# Patient Record
Sex: Female | Born: 1995 | Hispanic: No | Marital: Single | Smoking: Current every day smoker
Health system: Southern US, Community
[De-identification: ages and names within clinical notes are randomized; demographics above are authoritative.]

## PROBLEM LIST (undated history)

## (undated) HISTORY — PX: TONSILLECTOMY: SUR1361

## (undated) HISTORY — PX: WISDOM TOOTH EXTRACTION: SHX21

## (undated) HISTORY — PX: ADENOIDECTOMY: SUR15

---
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
## 2015-03-26 ENCOUNTER — Encounter: Payer: Self-pay | Admitting: *Deleted

## 2015-03-26 ENCOUNTER — Emergency Department
Admission: EM | Admit: 2015-03-26 | Discharge: 2015-03-26 | Disposition: A | Discharge status: Home / Self Care | Payer: Medicaid Other | Source: Home / Self Care | Attending: Family Medicine | Admitting: Family Medicine

## 2015-03-26 DIAGNOSIS — J029 Acute pharyngitis, unspecified: Secondary | ICD-10-CM | POA: Diagnosis not present

## 2015-03-26 LAB — POCT RAPID STREP A (OFFICE): Rapid Strep A Screen: NEGATIVE

## 2015-03-26 NOTE — Discharge Instructions (Signed)

## 2015-03-26 NOTE — ED Provider Notes (Signed)
CSN: [id number]     Arrival date & time 03/26/15  1114 History   First MD Initiated Contact with Patient 03/26/15 1140     Chief Complaint  Patient presents with  . Sore Throat   (Consider location/radiation/quality/duration/timing/severity/associated sxs/prior Treatment) HPI Pt is a 19yo female presenting to [facility] with c/o body aches with associated sore throat, headache and fever, Tmax 100.5.  Denies sick contacts or recent travel.  She did take ibuprofen last night that provided moderate relief but no medication today for symptoms. Denies CP or SOB. Denies cough, congestion, n/v/d.  History reviewed. No pertinent past medical history. Past Surgical History  Procedure Laterality Date  . Tonsillectomy    . Adenoidectomy    . Wisdom tooth extraction     History reviewed. No pertinent family history. Social History  Substance Use Topics  . Smoking status: Current Every Day Smoker -- 0.50 packs/day    Types: Cigarettes  . Smokeless tobacco: Current User     Comment: e-cig  . Alcohol Use: No   OB History    Gravida Para Term Preterm AB TAB SAB Ectopic Multiple Living   1              Review of Systems  Constitutional: Positive for fever ( 100.5), chills and fatigue.  HENT: Positive for sore throat. Negative for congestion, ear pain, trouble swallowing and voice change.   Respiratory: Negative for cough and shortness of breath.   Cardiovascular: Negative for chest pain and palpitations.  Gastrointestinal: Negative for nausea, vomiting, abdominal pain and diarrhea.  Musculoskeletal: Positive for myalgias and arthralgias. Negative for back pain, neck pain and neck stiffness.  Skin: Negative for rash.  Neurological: Positive for dizziness and headaches. Negative for syncope, weakness and light-headedness.  All other systems reviewed and are negative.   Allergies  Sulfa antibiotics  Home Medications   Prior to Admission medications   Not on File   Meds Ordered and  Administered this Visit  Medications - No data to display  BP 95/61 mmHg  Pulse 69  Temp(Src) 98.9 F (37.2 C) (Oral)  Resp 16  Ht  (1.549 m)  Wt 99 lb (44.906 kg)  BMI 18.72 kg/m2  SpO2 100%  Breastfeeding? Unknown No data found.   Physical Exam  Constitutional: She appears well-developed and well-nourished. No distress.  HENT:  Head: Normocephalic and atraumatic.  Right Ear: Hearing, tympanic membrane, external ear and ear canal normal.  Left Ear: Hearing, tympanic membrane, external ear and ear canal normal.  Nose: Nose normal.  Mouth/Throat: Uvula is midline and mucous membranes are normal. Oral lesions (sores in posterior pharynx) present. Posterior oropharyngeal erythema present. No oropharyngeal exudate, posterior oropharyngeal edema or tonsillar abscesses.  Eyes: Conjunctivae are normal. No scleral icterus.  Neck: Normal range of motion. Neck supple.  Cardiovascular: Normal rate, regular rhythm and normal heart sounds.   Pulmonary/Chest: Effort normal and breath sounds normal. No respiratory distress. She has no wheezes. She has no rales. She exhibits no tenderness.  Abdominal: Soft. Bowel sounds are normal. She exhibits no distension and no mass. There is no tenderness. There is no rebound and no guarding.  Musculoskeletal: Normal range of motion.  Lymphadenopathy:    She has no cervical adenopathy.  Neurological: She is alert.  Skin: Skin is warm and dry. She is not diaphoretic.  Nursing note and vitals reviewed.   ED Course  Procedures (including critical care time)  Labs Review Labs Reviewed  STREP A DNA PROBE  POCT RAPID STREP A (OFFICE)    Imaging Review No results found.    MDM   1. Acute pharyngitis, unspecified pharyngitis type    Pt c/o sore throat and body aches. Temp of 100.5 at home. 98.9 in UC w/o OTC medications today. Rapid strep: negative. Will send culture. Reassured pt symptoms are likely viral in nature. Pt has hx of mono but  states those symptoms were much worse. Conservative treatment. Advised pt to use acetaminophen and ibuprofen as needed for fever and pain. Encouraged rest and fluids. F/u in 1 week with PCP if symptoms not improving, sooner if worsening. Pt verbalized understanding and agreement with tx plan.     Junius Finner, PA-C 03/26/15 1330

## 2015-03-26 NOTE — ED Notes (Signed)
Pt c/o sore throat, temp 100.5 and blister on throat x 1 day. No  OTC meds today.

## 2015-03-27 ENCOUNTER — Telehealth: Payer: Self-pay | Admitting: *Deleted

## 2015-03-27 LAB — STREP A DNA PROBE: GASP: NEGATIVE

## 2018-01-27 ENCOUNTER — Other Ambulatory Visit: Payer: Self-pay

## 2018-01-27 ENCOUNTER — Emergency Department (INDEPENDENT_AMBULATORY_CARE_PROVIDER_SITE_OTHER): Payer: Medicaid Other

## 2018-01-27 ENCOUNTER — Emergency Department
Admission: EM | Admit: 2018-01-27 | Discharge: 2018-01-27 | Disposition: A | Discharge status: Home / Self Care | Payer: Medicaid Other | Source: Home / Self Care | Attending: Family Medicine | Admitting: Family Medicine

## 2018-01-27 DIAGNOSIS — M5416 Radiculopathy, lumbar region: Secondary | ICD-10-CM | POA: Diagnosis not present

## 2018-01-27 DIAGNOSIS — M5442 Lumbago with sciatica, left side: Secondary | ICD-10-CM

## 2018-01-27 MED ORDER — PREDNISONE 20 MG PO TABS: ORAL_TABLET | ORAL | 0 refills | Status: DC

## 2018-01-27 MED ORDER — HYDROCODONE-ACETAMINOPHEN 5-325 MG PO TABS: ORAL_TABLET | ORAL | 0 refills | Status: DC

## 2018-01-27 NOTE — ED Provider Notes (Signed)
[facility]    CSN: [id number] Arrival date & time: 01/27/18  1753     History   Chief Complaint Chief Complaint  Patient presents with  . Back Pain    HPI Natalie Levy is a 22 y.o. female.   One week ago patient began developing sharp pain in her left lower back that immediately began to radiate down her left posterior thigh to her knee.  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  She now has pain when lifting trays.   She denies bowel or bladder dysfunction, and no saddle numbness.  She feels well otherwise.  The history is provided by the patient.  Back Pain  Location:  Gluteal region and sacro-iliac joint Quality:  Stabbing and shooting Radiates to:  L posterior upper leg and L knee Pain severity:  Moderate Pain is:  Same all the time Onset quality:  Sudden Duration:  1 week Timing:  Constant Progression:  Unchanged Chronicity:  New Context: lifting heavy objects   Context: not recent injury   Relieved by:  Lying down Worsened by:  Bending, ambulation and standing Ineffective treatments:  OTC medications Associated symptoms: no abdominal pain, no abdominal swelling, no bladder incontinence, no bowel incontinence, no chest pain, no dysuria, no fever, no headaches, no leg pain, no numbness, no paresthesias, no pelvic pain, no perianal numbness, no tingling, no weakness and no weight loss     History reviewed. No pertinent past medical history.  There are no active problems to display for this patient.   Past Surgical History:  Procedure Laterality Date  . ADENOIDECTOMY    . TONSILLECTOMY    . WISDOM TOOTH EXTRACTION      OB History    Gravida  1   Para      Term      Preterm      AB      Living        SAB      TAB      Ectopic      Multiple      Live Births               Home Medications    Prior to Admission medications   Medication Sig Start Date End Date Taking? Authorizing Provider    HYDROcodone-acetaminophen (NORCO/VICODIN) 5-325 MG tablet Take one by mouth at bedtime as needed for pain 01/27/18   Lattie Haw, MD  predniSONE (DELTASONE) 20 MG tablet Take one tab by mouth twice daily for 4 days, then one daily. Take with food. 01/27/18   Lattie Haw, MD    Family History History reviewed. No pertinent family history.  Social History Social History   Tobacco Use  . Smoking status: Current Every Day Smoker    Packs/day: 0.50    Types: Cigarettes, E-cigarettes  . Smokeless tobacco: Current User  . Tobacco comment: e-cig  Substance Use Topics  . Alcohol use: Yes  . Drug use: No     Allergies   Sulfa antibiotics   Review of Systems Review of Systems  Constitutional: Negative for fever and weight loss.  Cardiovascular: Negative for chest pain.  Gastrointestinal: Negative for abdominal pain and bowel incontinence.  Genitourinary: Negative for bladder incontinence, dysuria and pelvic pain.  Musculoskeletal: Positive for back pain.  Neurological: Negative for tingling, weakness, numbness, headaches and paresthesias.  All other systems reviewed and are negative.    Physical Exam Triage Vital Signs ED Triage Vitals  Enc Vitals Group     BP 01/27/18 1813 121/81     Pulse Rate 01/27/18 1813 74     Resp --      Temp 01/27/18 1813 98.3 F (36.8 C)     Temp Source 01/27/18 1813 Oral     SpO2 01/27/18 1813 99 %     Weight 01/27/18 1814 103 lb (46.7 kg)     Height 01/27/18 1814 5\' 1"  (1.549 m)     Head Circumference --      Peak Flow --      Pain Score 01/27/18 1813 5     Pain Loc --      Pain Edu? --      Excl. in GC? --    No data found.  Updated Vital Signs BP 121/81 (BP Location: Right Arm)   Pulse 74   Temp 98.3 F (36.8 C) (Oral)   Ht 5\' 1"  (1.549 m)   Wt 103 lb (46.7 kg)   SpO2 99%   BMI 19.46 kg/m   Visual Acuity Right Eye Distance:   Left Eye Distance:   Bilateral Distance:    Right Eye Near:   Left Eye Near:     Bilateral Near:     Physical Exam  Constitutional: She appears well-developed and well-nourished. No distress.  HENT:  Head: Normocephalic.  Right Ear: External ear normal.  Left Ear: External ear normal.  Nose: Nose normal.  Mouth/Throat: Oropharynx is clear and moist.  Eyes: Pupils are equal, round, and reactive to light. Conjunctivae are normal.  Neck: Normal range of motion.  Cardiovascular: Normal heart sounds.  Pulmonary/Chest: Breath sounds normal.  Abdominal: Soft. There is no tenderness.  Musculoskeletal: She exhibits no edema.       Lumbar back: She exhibits tenderness and bony tenderness. She exhibits normal range of motion and no swelling.       Back:  Back:  Range of motion relatively well preserved.  Can heel/toe walk and squat without difficulty.    Tenderness in the left paraspinous muscles from L4 to Sacral area.  Straight leg raising test is negative.  Sitting knee extension test is positive at 10 degrees from full extension.   Strength and sensation in the lower extremities is normal.  Patellar and achilles reflexes are normal   Neurological: She is alert.  Skin: Skin is warm and dry. No rash noted.  Nursing note and vitals reviewed.    UC Treatments / Results  Labs (all labs ordered are listed, but only abnormal results are displayed) Labs Reviewed - No data to display  EKG None  Radiology Dg Lumbar Spine Complete  Result Date: 01/27/2018 CLINICAL DATA:  Low back pain radiates to left lateral thigh and knee. EXAM: LUMBAR SPINE - COMPLETE 4+ VIEW COMPARISON:  None. FINDINGS: There is no evidence of lumbar spine fracture. Alignment is normal. Intervertebral disc spaces are maintained. IMPRESSION: Negative. Electronically Signed   By: Kennith CenterEric  Mansell M.D.   On: 01/27/2018 19:00    Procedures Procedures (including critical care time)  Medications Ordered in UC Medications - No data to display  Initial Impression / Assessment and Plan / UC Course  I have  reviewed the triage vital signs and the nursing notes.  Pertinent labs & imaging results that were available during my care of the patient were reviewed by me and considered in my medical decision making (see chart for details).    Negative LS spine X=ray reassuring. Begin prednisone burst/taper. Rx for [employer] PowerLortab  at bedtime. Controlled Substance Prescriptions I have consulted the [place] Controlled Substances Registry for this patient, and feel the risk/benefit ratio today is favorable for proceeding with this prescription for a controlled substance.   Followup with Dr. Rodney Langton or Dr. Clementeen Graham ([facility]) if not improving about two weeks.    Final Clinical Impressions(s) / UC Diagnoses   Final diagnoses:  Acute left-sided low back pain with left-sided sciatica     Discharge Instructions     Apply ice pack for 20 to 30 minutes, 3 to 4 times daily  Continue until pain and swelling decrease.  Begin range of motion and stretching exercises as tolerated    ED Prescriptions    Medication Sig Dispense Auth. Provider   predniSONE (DELTASONE) 20 MG tablet Take one tab by mouth twice daily for 4 days, then one daily. Take with food. 12 tablet Lattie Haw, MD   HYDROcodone-acetaminophen (NORCO/VICODIN) 5-325 MG tablet Take one by mouth at bedtime as needed for pain 5 tablet Lattie Haw, MD        Lattie Haw, MD 02/03/18 [id number]

## 2018-01-27 NOTE — Discharge Instructions (Signed)
Apply ice pack for 20 to 30 minutes, 3 to 4 times daily  Continue until pain and swelling decrease.  Begin range of motion and stretching exercises as tolerated. 

## 2018-01-27 NOTE — ED Triage Notes (Signed)
Pt has had lower back pain from left hip shooting down to knee for about 1 week.

## 2019-02-27 ENCOUNTER — Other Ambulatory Visit: Payer: Self-pay

## 2019-02-27 ENCOUNTER — Emergency Department (INDEPENDENT_AMBULATORY_CARE_PROVIDER_SITE_OTHER)
Admission: EM | Admit: 2019-02-27 | Discharge: 2019-02-27 | Disposition: A | Discharge status: Home / Self Care | Payer: Medicaid Other | Source: Home / Self Care | Attending: Emergency Medicine | Admitting: Emergency Medicine

## 2019-02-27 DIAGNOSIS — R8290 Unspecified abnormal findings in urine: Secondary | ICD-10-CM | POA: Diagnosis not present

## 2019-02-27 DIAGNOSIS — N10 Acute tubulo-interstitial nephritis: Secondary | ICD-10-CM | POA: Diagnosis not present

## 2019-02-27 DIAGNOSIS — R112 Nausea with vomiting, unspecified: Secondary | ICD-10-CM

## 2019-02-27 LAB — POCT URINE PREGNANCY: Preg Test, Ur: NEGATIVE

## 2019-02-27 LAB — POCT URINALYSIS DIP (MANUAL ENTRY)
Bilirubin, UA: NEGATIVE
Glucose, UA: NEGATIVE mg/dL
Ketones, POC UA: NEGATIVE mg/dL
Nitrite, UA: NEGATIVE
Protein Ur, POC: >=300 mg/dL — AB
Spec Grav, UA: 1.025 (ref 1.010–1.025)
Urobilinogen, UA: 0.2 E.U./dL
pH, UA: 6 (ref 5.0–8.0)

## 2019-02-27 MED ORDER — CEFTRIAXONE SODIUM 1 G IJ SOLR
1.0000 g | INTRAMUSCULAR | Status: AC
  Administered 2019-02-27: 14:00:00 1 g via INTRAMUSCULAR

## 2019-02-27 MED ORDER — ONDANSETRON 4 MG PO TBDP: 4.0000 mg | ORAL_TABLET | Freq: Three times a day (TID) | ORAL | 0 refills | Status: DC | PRN

## 2019-02-27 MED ORDER — ONDANSETRON 4 MG PO TBDP
4.0000 mg | ORAL_TABLET | ORAL | Status: AC
  Administered 2019-02-27: 4 mg via ORAL

## 2019-02-27 MED ORDER — CIPROFLOXACIN HCL 500 MG PO TABS: 500.0000 mg | ORAL_TABLET | Freq: Two times a day (BID) | ORAL | 0 refills | Status: DC

## 2019-02-27 NOTE — Discharge Instructions (Addendum)
Diagnosis is right kidney infection. Urine pregnancy test negative.  Urine culture is pending. Here in urgent care we gave you Zofran under the tongue which helped nausea and vomiting. Today, we are giving you a shot of Rocephin, a strong antibiotic, to jumpstart treating your kidney infection. Were sending an antibiotic prescription and prescription for more Zofran (for nausea and vomiting) to your pharmacy. We are trying to arrange follow-up with family medicine office here in this building within 2 days. You need to drink plenty of clear liquids.  If you are unable to keep liquids down, or if your symptoms worsen, you need to go to emergency room immediately.

## 2019-02-27 NOTE — ED Triage Notes (Signed)
Pt c/o sudden onset of nausea and vomiting last night. Also chills. Did not have a thermometer to take temperature. Also c/o of lower back pain. Mentions during her last menstrual cycle, she had a lot of pain during to tampon use.

## 2019-02-27 NOTE — ED Provider Notes (Addendum)
[facility]    CSN: [id number] Arrival date & time: 02/27/19  1115     History   Chief Complaint Chief Complaint  Patient presents with  . Nausea  . Emesis  . Chills    HPI Natalie Levy is a 23 y.o. female.   HPI Started 2 days ago with dysuria and suprapubic pain.  Progressed yesterday to nausea, then vomited x2 last night and vomited x6 this morning.  No blood in vomit.  Associated with fever and chills.  For past 24 hours, complains of worsening right flank and right low mid back pain.  Pain intensity 3 out of 10.  Denies left-sided back pain.  Other than suprapubic and right flank pain, denies specific abdominal pain. Because of nausea and vomiting, she has not been able to tolerate any solids or any clear liquids the past 12 hours. Denies rash or history of insect or tick bite. She lives with her daughter and fianc, and they are asymptomatic. She states she is monogamous with her fianc. No diarrhea.  BMs have been normal. Over the past 24 hours developed fever and chills. Denies any respiratory symptoms.  No cough or chest pain or shortness of breath or headache or change of smell or taste. Last menstrual period started at her expected time 5 days ago, had normal flow and now menstrual flow has almost completely resolved.  She denies having retained tampon. She otherwise denies any vaginal discharge or any recent pelvic pain.  By her verbal history and also after I carefully reviewed care everywhere, she was seen at [facility] emergency room 11/30/2018 for abdominal pain and UTI symptoms, CT abdomen pelvis without contrast was negative for acute process, there was punctate nonobstructing calculus left kidney, but negative for obstructive uropathy or ureteral calculus. She was prescribed ampicillin at that time in the ER, and she states her symptoms slowly but completely resolved. I note in the records from ER that urine culture eventually grew out  E. coli that was resistant to ampicillin and sulfa, but sensitive to Rocephin and other cephalosporins.  History reviewed. No pertinent past medical history.  There are no active problems to display for this patient.   Past Surgical History:  Procedure Laterality Date  . ADENOIDECTOMY    . TONSILLECTOMY    . WISDOM TOOTH EXTRACTION      OB History    Gravida  1   Para      Term      Preterm      AB      Living        SAB      TAB      Ectopic      Multiple      Live Births               Home Medications    Prior to Admission medications   Medication Sig Start Date End Date Taking? Authorizing Provider  ciprofloxacin (CIPRO) 500 MG tablet Take 1 tablet (500 mg total) by mouth 2 (two) times daily. For 10 days 02/27/19   Jacqulyn Cane, MD  ondansetron (ZOFRAN-ODT) 4 MG disintegrating tablet Take 1 tablet (4 mg total) by mouth every 8 (eight) hours as needed for nausea or vomiting. 02/27/19   Jacqulyn Cane, MD    Family History History reviewed. No pertinent family history.  Social History Social History   Tobacco Use  . Smoking status: Current Every Day Smoker    Packs/day: 0.50  Types: Cigarettes, E-cigarettes  . Smokeless tobacco: Current User  . Tobacco comment: e-cig  Substance Use Topics  . Alcohol use: Yes  . Drug use: No     Allergies   Sulfa antibiotics   Review of Systems Review of Systems  All other systems reviewed and are negative.  Pertinent items noted in HPI and remainder of comprehensive ROS otherwise negative.   Physical Exam Triage Vital Signs ED Triage Vitals  Enc Vitals Group     BP 02/27/19 1206 110/73     Pulse Rate 02/27/19 1206 93     Resp 02/27/19 1206 18     Temp 02/27/19 1206 99.6 F (37.6 C)     Temp Source 02/27/19 1206 Oral     SpO2 02/27/19 1206 100 %     Weight --      Height --      Head Circumference --      Peak Flow --      Pain Score 02/27/19 1207 3     Pain Loc --      Pain Edu? --       Excl. in GC? --    No data found.  Updated Vital Signs BP 110/73 (BP Location: Left Arm)   Pulse 93   Temp 99.6 F (37.6 C) (Oral)   Resp 18   LMP 02/22/2019 (Approximate)   SpO2 100%   Physical Exam Vitals signs and nursing note reviewed.  Constitutional:      General: She is not in acute distress.  Alert, cooperative.  She appears acutely ill, but not toxic.    Appearance: She is well-developed.  No acute respiratory distress. Eyes:     General: No scleral icterus.  EOMI, [place] Neck:     Musculoskeletal: Neck supple.  No adenopathy. Cardiovascular:     Rate and Rhythm: Normal rate and regular rhythm.     Heart sounds: Normal heart sounds.  No murmur Pulmonary:     Breath sounds: Normal breath sounds.  Breath sounds equal bilaterally.  Oxygen saturation 100% on room air Abdominal:      Abdomen is soft.     Tenderness: There is mild tenderness suprapubic area and mild to moderate tenderness right flank. Positive right CVA tenderness.  There is no guarding or rebound.  No hepatosplenomegaly or masses. Pelvic: She declined pelvic exam today. Lymphadenopathy:     Cervical: No cervical adenopathy.  Skin:    General: Skin is warm and dry.  No rash. Neurological:     Mental Status: She is alert and oriented to person, place, and time.  Motor and sensory intact.     UC Treatments / Results  Labs (all labs ordered are listed, but only abnormal results are displayed) Labs Reviewed  POCT URINALYSIS DIP (MANUAL ENTRY) - Abnormal; Notable for the following components:      Result Value   Color, UA other (*)    Clarity, UA cloudy (*)    Blood, UA large (*)    Protein Ur, POC >=300 (*)    Leukocytes, UA Moderate (2+) (*)    All other components within normal limits  URINE CULTURE  POCT URINE PREGNANCY  Urine pregnancy test negative. Urine culture sent off.  EKG   Radiology No results found.  Procedures Procedures (including critical care time)  Medications  Ordered in UC Medications  ondansetron (ZOFRAN-ODT) disintegrating tablet 4 mg (4 mg Oral Given 02/27/19 1233)  cefTRIAXone (ROCEPHIN) injection 1 g (1 g Intramuscular Given 02/27/19  1341)   12:40 PM.  Initial impression : nausea vomiting and right flank and CVA pain, fever and chills consistent with UTI and right pyelonephritis.  Urine pregnancy test today negative. Giving Zofran ODT to see if that helps with nausea and vomiting and to see if she can tolerate p.o.'s and to see if we can treat as an outpatient.  If she cannot tolerate p.o.'s, will need to send to emergency room. [facility] showed essentially negative CT abdomen and pelvis, with punctate nonobstructing calculus left kidney but otherwise negative without any right renal calculi. Urine culture at that time grew out E. coli, resistant to ampicillin and sulfa, but sensitive to all cephalosporins..  1:16 PM.  Patient reevaluated.  Nausea and vomiting now resolved after Zofran.  She was able to tolerate 8 ounces of water here in urgent care without nausea or vomiting. Therefore, will treat with Rocephin IM stat, give prescription for oral antibiotic and Zofran, with close follow-up with PCP within 1 to 2 days.  Precautions discussed at length and explained needs to go to emergency room stat if any worsening or inability to tolerate p.o.'s or persistent vomiting.   Initial Impression / Assessment and Plan / UC Course  I have reviewed the triage vital signs and the nursing notes.  Pertinent labs & imaging results that were available during my care of the patient were reviewed by me and considered in my medical decision making (see chart for details).      Final Clinical Impressions(s) / UC Diagnoses   Final diagnoses:  Abnormal finding on urinalysis  Acute pyelonephritis  Nausea and vomiting in adult     Discharge Instructions     Diagnosis is right kidney infection. Urine pregnancy test negative.  Urine  culture is pending. Here in urgent care we gave you Zofran under the tongue which helped nausea and vomiting. Today, we are giving you a shot of Rocephin, a strong antibiotic, to jumpstart treating your kidney infection. Were sending an antibiotic prescription and prescription for more Zofran (for nausea and vomiting) to your pharmacy. We are trying to arrange follow-up with family medicine office here in this building within 2 days. You need to drink plenty of clear liquids.  If you are unable to keep liquids down, or if your symptoms worsen, you need to go to emergency room immediately.    ED Prescriptions    Medication Sig Dispense Auth. Provider   ondansetron (ZOFRAN-ODT) 4 MG disintegrating tablet Take 1 tablet (4 mg total) by mouth every 8 (eight) hours as needed for nausea or vomiting. 5 tablet Lajean ManesMassey, David, MD   ciprofloxacin (CIPRO) 500 MG tablet Take 1 tablet (500 mg total) by mouth 2 (two) times daily. For 10 days 20 tablet Lajean ManesMassey, David, MD     She declined any pain medicine, but advised may use as needed for pain or fever.  Controlled Substance Prescriptions [place] Controlled Substance Registry consulted? Not Applicable   Over 50 minutes spent, greater than 50% of the time spent for counseling and coordination of care.   Lajean ManesMassey, David, MD 02/27/19 1352

## 2019-03-01 ENCOUNTER — Telehealth: Payer: Self-pay | Admitting: Emergency Medicine

## 2019-03-01 LAB — URINE CULTURE
MICRO NUMBER:: 753831
SPECIMEN QUALITY:: ADEQUATE

## 2019-03-01 NOTE — Telephone Encounter (Signed)
Unable to reach.

## 2019-03-02 NOTE — Telephone Encounter (Signed)
Unable to reach, VM not set up

## 2019-04-14 IMAGING — DX DG LUMBAR SPINE COMPLETE 4+V
5 series · 5 of 5 positions shown · non-contrast
Comparison: None.

CLINICAL DATA: Low back pain radiates to left lateral thigh and
knee.

EXAM:
LUMBAR SPINE - COMPLETE 4+ VIEW

[l-spine ap]
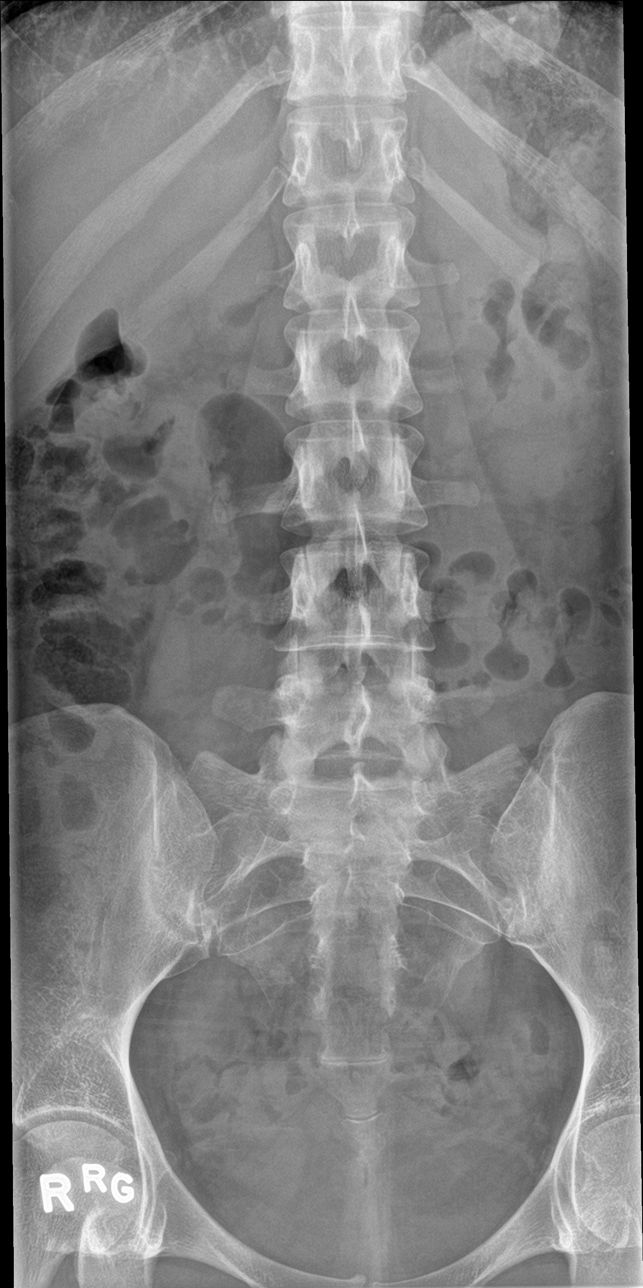

[l-spine obl (1 of 2)]
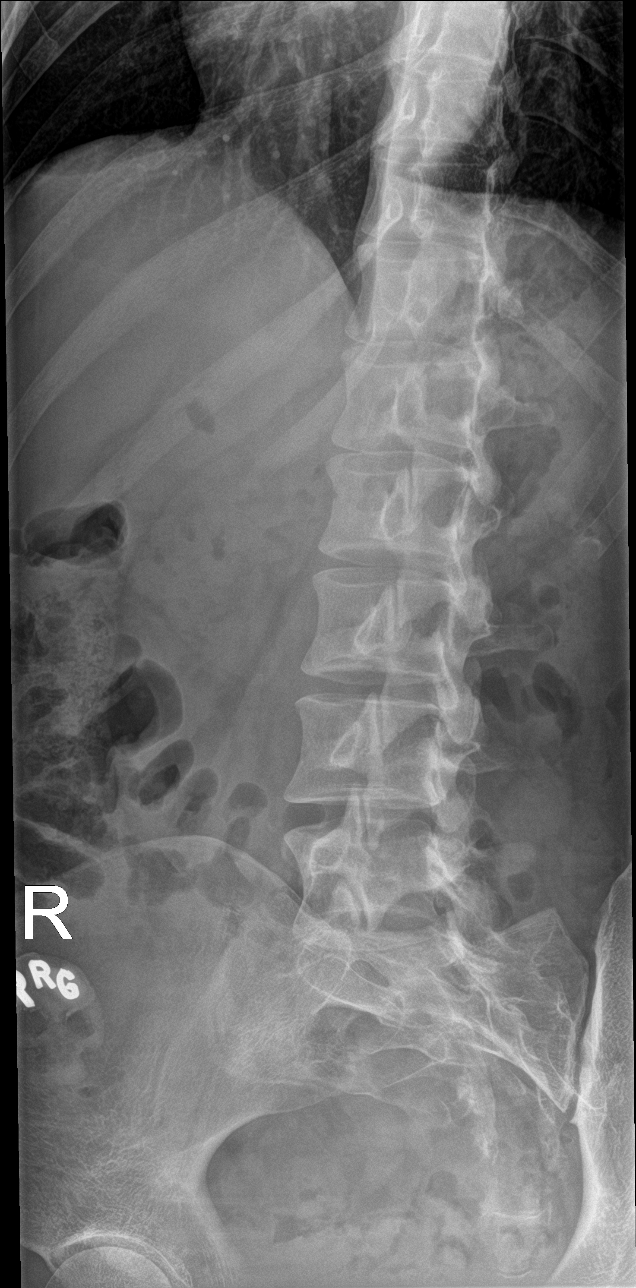

[l-spine obl (2 of 2)]
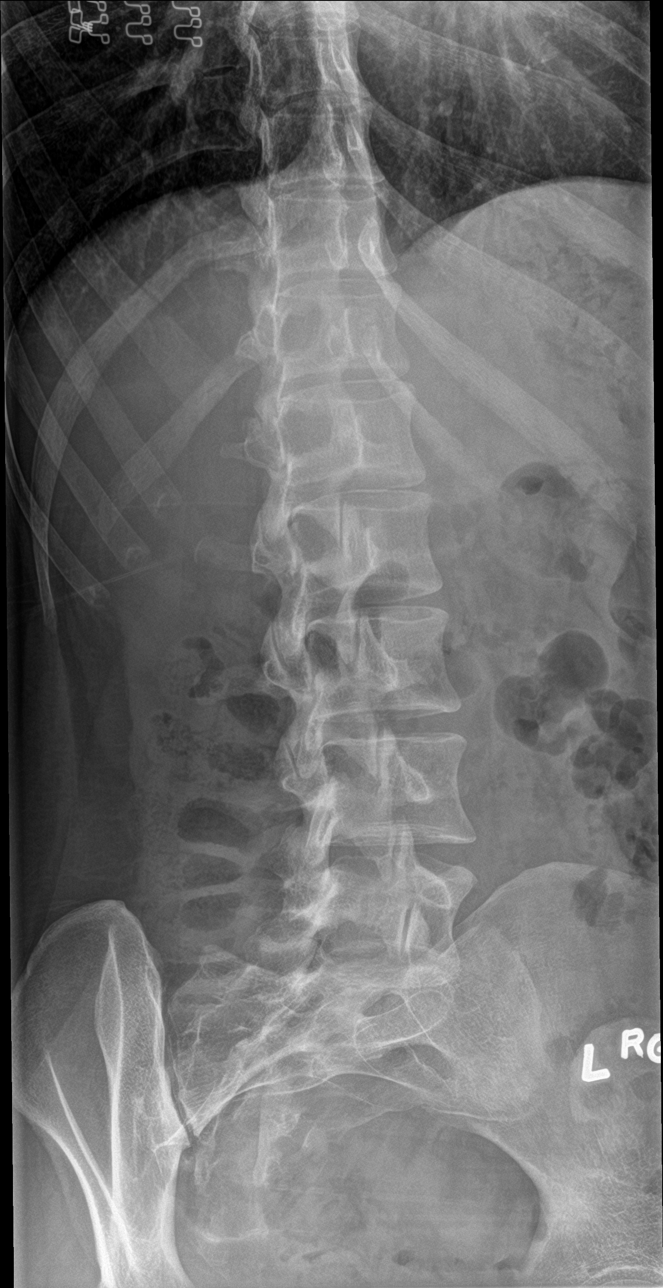

[l-spine lat]
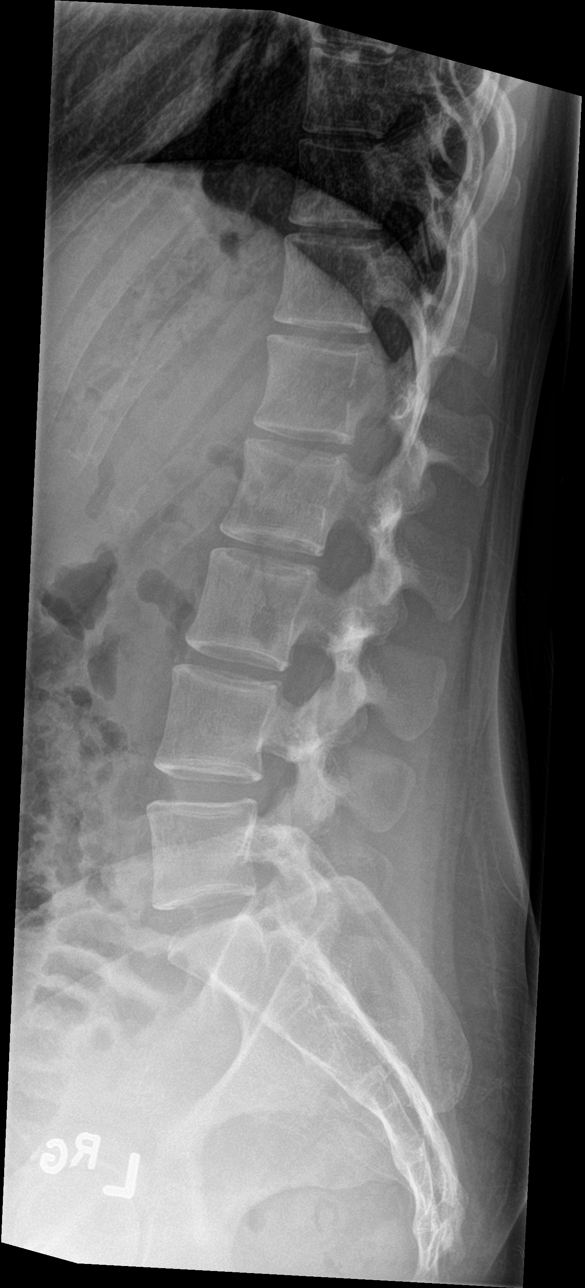

[l-spine spot]
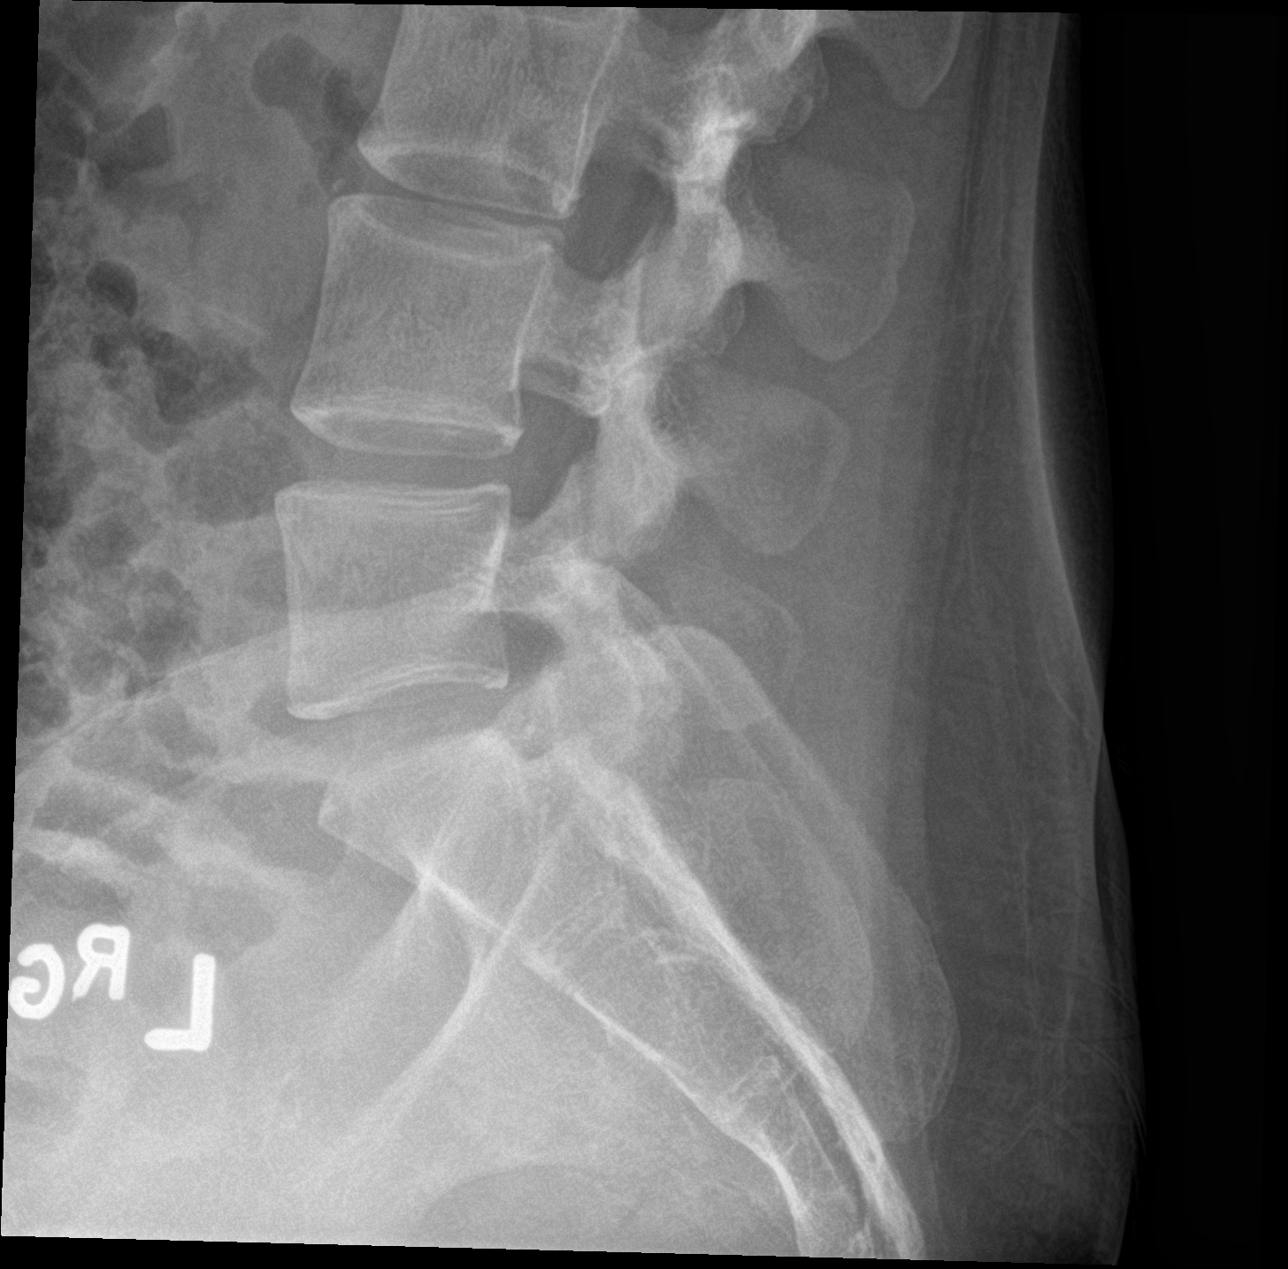

[5 of 5 positions shown; findings below may reference images not displayed]

FINDINGS: There is no evidence of lumbar spine fracture. Alignment is normal.
Intervertebral disc spaces are maintained.
IMPRESSION: Negative.

## 2019-06-30 ENCOUNTER — Emergency Department
Admission: EM | Admit: 2019-06-30 | Discharge: 2019-06-30 | Disposition: A | Discharge status: Home / Self Care | Payer: Medicaid Other | Source: Home / Self Care | Attending: Emergency Medicine | Admitting: Emergency Medicine

## 2019-06-30 ENCOUNTER — Encounter: Payer: Self-pay | Admitting: *Deleted

## 2019-06-30 ENCOUNTER — Other Ambulatory Visit: Payer: Self-pay

## 2019-06-30 ENCOUNTER — Other Ambulatory Visit (HOSPITAL_COMMUNITY)
Admission: RE | Admit: 2019-06-30 | Discharge: 2019-06-30 | Disposition: A | Discharge status: Home / Self Care | Payer: Medicaid Other | Source: Ambulatory Visit | Attending: Emergency Medicine | Admitting: Emergency Medicine

## 2019-06-30 DIAGNOSIS — R300 Dysuria: Secondary | ICD-10-CM | POA: Diagnosis not present

## 2019-06-30 DIAGNOSIS — N898 Other specified noninflammatory disorders of vagina: Secondary | ICD-10-CM

## 2019-06-30 LAB — POCT URINALYSIS DIP (MANUAL ENTRY)
Bilirubin, UA: NEGATIVE
Glucose, UA: NEGATIVE mg/dL
Ketones, POC UA: NEGATIVE mg/dL
Nitrite, UA: NEGATIVE
Protein Ur, POC: NEGATIVE mg/dL
Spec Grav, UA: >=1.03 — AB (ref 1.010–1.025)
Urobilinogen, UA: 1 E.U./dL
pH, UA: 7 (ref 5.0–8.0)

## 2019-06-30 LAB — POCT URINE PREGNANCY: Preg Test, Ur: NEGATIVE

## 2019-06-30 MED ORDER — NITROFURANTOIN MONOHYD MACRO 100 MG PO CAPS: 100.0000 mg | ORAL_CAPSULE | Freq: Two times a day (BID) | ORAL | 0 refills | Status: AC

## 2019-06-30 NOTE — Discharge Instructions (Addendum)
We will contact you with culture results. Take antibiotics twice a day. The swab is positive for any STD would advise you to have follow-up blood work done

## 2019-06-30 NOTE — ED Provider Notes (Signed)
[name]    CSN: [id number] Arrival date & time: 06/30/19  1245      History   Chief Complaint Chief Complaint  Patient presents with  . Dysuria    HPI Natalie Levy is a 23 y.o. female.  Patient enters with a concern regarding burning on urination and irritation to her urethra.  She has had no visible source.  She does have an odor to her urine and to her vaginal discharge.  She has had chlamydia and trichomoniasis in the distant past.  She had intercourse with her husband and a female sexual partner at the same time 6 days ago.  She has had no fever chills she is not on birth control last menstrual period was 2 weeks ago. HPI  No past medical history on file.  There are no problems to display for this patient.   Past Surgical History:  Procedure Laterality Date  . ADENOIDECTOMY    . TONSILLECTOMY    . WISDOM TOOTH EXTRACTION      OB History    Gravida  1   Para      Term      Preterm      AB      Living        SAB      TAB      Ectopic      Multiple      Live Births               Home Medications    Prior to Admission medications   Medication Sig Start Date End Date Taking? Authorizing Provider  nitrofurantoin, macrocrystal-monohydrate, (MACROBID) 100 MG capsule Take 1 capsule (100 mg total) by mouth 2 (two) times daily. 06/30/19   Darlyne Russian, MD    Family History No family history on file.  Social History Social History   Tobacco Use  . Smoking status: Current Every Day Smoker    Packs/day: 0.50    Types: Cigarettes, E-cigarettes  . Smokeless tobacco: Never Used  . Tobacco comment: e-cig  Substance Use Topics  . Alcohol use: Yes  . Drug use: No     Allergies   Sulfa antibiotics   Review of Systems Review of Systems  Constitutional: Negative for fever.  Gastrointestinal: Negative for abdominal pain and nausea.  Genitourinary: Positive for dysuria, urgency and vaginal discharge. Negative for  flank pain and genital sores.  Neurological: Negative.      Physical Exam Triage Vital Signs ED Triage Vitals  Enc Vitals Group     BP 06/30/19 0846 109/76     Pulse Rate 06/30/19 0846 66     Resp 06/30/19 0846 16     Temp 06/30/19 0846 98 F (36.7 C)     Temp Source 06/30/19 0846 Oral     SpO2 06/30/19 0846 100 %     Weight 06/30/19 0844 105 lb (47.6 kg)     Height 06/30/19 0844 5\' 1"  (1.549 m)     Head Circumference --      Peak Flow --      Pain Score 06/30/19 0844 3     Pain Loc --      Pain Edu? --      Excl. in [place]? --    No data found.  Updated Vital Signs BP 109/76 (BP Location: Left Arm)   Pulse 66   Temp 98 F (36.7 C) (Oral)   Resp 16   Ht 5\' 1"  (1.549  m)   Wt 47.6 kg   LMP 06/19/2019   SpO2 100%   BMI 19.84 kg/m   Visual Acuity Right Eye Distance:   Left Eye Distance:   Bilateral Distance:    Right Eye Near:   Left Eye Near:    Bilateral Near:     Physical Exam Constitutional:      Appearance: Normal appearance.  Cardiovascular:     Rate and Rhythm: Normal rate.  Pulmonary:     Effort: Pulmonary effort is normal.     Breath sounds: Normal breath sounds.  Abdominal:     General: Abdomen is flat.     Comments: There is very mild tenderness suprapubic area and slightly to the right no rebound.  No tenderness on the left.  Musculoskeletal:     Cervical back: Normal range of motion.  Neurological:     Mental Status: She is alert.      UC Treatments / Results  Labs (all labs ordered are listed, but only abnormal results are displayed) Labs Reviewed  POCT URINALYSIS DIP (MANUAL ENTRY) - Abnormal; Notable for the following components:      Result Value   Clarity, UA cloudy (*)    Spec Grav, UA >=1.030 (*)    Blood, UA small (*)    Leukocytes, UA Small (1+) (*)    All other components within normal limits  URINE CULTURE  POCT URINE PREGNANCY  CERVICOVAGINAL ANCILLARY ONLY    EKG   Radiology No results found.  Procedures  Procedures (including critical care time)  Medications Ordered in UC Medications - No data to display  Initial Impression / Assessment and Plan / UC Course  I have reviewed the triage vital signs and the nursing notes. STD testing was done.  Urine was sent for culture.  We will check the urine pregnancy test.  Will treat with Macrobid twice a day for 5 days.  If her STD testing is positive would advise blood work.  There was a high risk sexual encounter on Saturday which is 6 days ago.  Pregnancy test done was negative. Pertinent labs & imaging results that were available during my care of the patient were reviewed by me and considered in my medical decision making (see chart for details).      Final Clinical Impressions(s) / UC Diagnoses   Final diagnoses:  Dysuria  Discharge from the vagina     Discharge Instructions     We will contact you with culture results. Take antibiotics twice a day. The swab is positive for any STD would advise you to have follow-up blood work done    ED Prescriptions    Medication Sig Dispense Auth. Provider   nitrofurantoin, macrocrystal-monohydrate, (MACROBID) 100 MG capsule Take 1 capsule (100 mg total) by mouth 2 (two) times daily. 10 capsule Collene Gobble, MD     PDMP not reviewed this encounter.   Collene Gobble, MD 06/30/19 1459

## 2019-06-30 NOTE — ED Triage Notes (Signed)
Pt c/o dysuria, urinary frequency and foul smelling urine x 5 days.

## 2019-07-02 LAB — URINE CULTURE
MICRO NUMBER:: 1189451
SPECIMEN QUALITY:: ADEQUATE

## 2019-07-04 ENCOUNTER — Telehealth (HOSPITAL_COMMUNITY): Payer: Self-pay | Admitting: Emergency Medicine

## 2019-07-04 LAB — CERVICOVAGINAL ANCILLARY ONLY
Bacterial vaginitis: POSITIVE — AB
Candida vaginitis: POSITIVE — AB
Chlamydia: NEGATIVE
Neisseria Gonorrhea: NEGATIVE
Trichomonas: NEGATIVE

## 2019-07-04 MED ORDER — FLUCONAZOLE 150 MG PO TABS: 150.0000 mg | ORAL_TABLET | Freq: Once | ORAL | 0 refills | Status: AC

## 2019-07-04 MED ORDER — METRONIDAZOLE 500 MG PO TABS: 500.0000 mg | ORAL_TABLET | Freq: Two times a day (BID) | ORAL | 0 refills | Status: AC

## 2019-07-04 NOTE — Telephone Encounter (Signed)
Bacterial vaginosis is positive. This was not treated at the urgent care visit.  Flagyl 500 mg BID x 7 days #14 no refills sent to patients pharmacy of choice.    Test for candida (yeast) was positive.  Prescription for fluconazole 150mg po now, repeat dose in 3d if needed, #2 no refills, sent to the pharmacy of record.  Recheck or followup with PCP for further evaluation if symptoms are not improving.    Patient contacted by phone and made aware of    results. Pt verbalized understanding and had all questions answered.    

## 2019-12-15 ENCOUNTER — Emergency Department
Admission: EM | Admit: 2019-12-15 | Discharge: 2019-12-15 | Disposition: A | Discharge status: Home / Self Care | Payer: Medicaid Other | Source: Home / Self Care

## 2019-12-15 ENCOUNTER — Other Ambulatory Visit: Payer: Self-pay

## 2019-12-15 ENCOUNTER — Other Ambulatory Visit (HOSPITAL_COMMUNITY)
Admission: RE | Admit: 2019-12-15 | Discharge: 2019-12-15 | Disposition: A | Discharge status: Home / Self Care | Payer: Medicaid Other | Source: Ambulatory Visit | Attending: Family Medicine | Admitting: Family Medicine

## 2019-12-15 DIAGNOSIS — R509 Fever, unspecified: Secondary | ICD-10-CM

## 2019-12-15 DIAGNOSIS — A6004 Herpesviral vulvovaginitis: Secondary | ICD-10-CM

## 2019-12-15 DIAGNOSIS — B001 Herpesviral vesicular dermatitis: Secondary | ICD-10-CM

## 2019-12-15 DIAGNOSIS — N898 Other specified noninflammatory disorders of vagina: Secondary | ICD-10-CM

## 2019-12-15 MED ORDER — ZINC OXIDE 40 % EX PSTE: 1.0000 "application " | PASTE | CUTANEOUS | 0 refills | Status: AC | PRN

## 2019-12-15 MED ORDER — VALACYCLOVIR HCL 1 G PO TABS: 1000.0000 mg | ORAL_TABLET | Freq: Three times a day (TID) | ORAL | 0 refills | Status: AC

## 2019-12-15 NOTE — Discharge Instructions (Signed)
  Take the medication as prescribed to help shorten the duration of your rash.  Based on today's exam, the sore are consistent with herpes virus. This virus can cause sores to keep coming back during times of physical (sick or injured) and emotional stress.   If you develop tingling or itching to your lips or vaginal area in the future, this is likely a sign the rash is about to come up again. It is recommended to start taking the antiviral medication (which will be a different dose for additional outbreaks).    Please follow up with your OB/GYN or primary care provider for ongoing healthcare needs and any recurrent outbreaks.

## 2019-12-15 NOTE — ED Provider Notes (Signed)
[name]    CSN: [id number] Arrival date & time: 12/15/19  0840      History   Chief Complaint Chief Complaint  Patient presents with  . SEXUALLY TRANSMITTED DISEASE    HPI Natalie Levy is a 24 y.o. female.   HPI Natalie Levy is a 24 y.o. female presenting to UC with c/o low grade fever with chills and sores that started on her lower lip and genitals that started about 8 days ago. Pain when the sores are touched or when she urinates.  She had a small amount of yellow vaginal discharge before her period started yesterday, 12/14/19.    Pt denies hx of similar sores. She has tried OTC triple antibiotic ointment w/o relief. She notes her husband started to develop similar symptoms around the same time as she did. No specific known exposure to STIs but pt would like to be tested.    History reviewed. No pertinent past medical history.  There are no problems to display for this patient.   Past Surgical History:  Procedure Laterality Date  . ADENOIDECTOMY    . TONSILLECTOMY    . WISDOM TOOTH EXTRACTION      OB History    Gravida  1   Para      Term      Preterm      AB      Living        SAB      TAB      Ectopic      Multiple      Live Births               Home Medications    Prior to Admission medications   Medication Sig Start Date End Date Taking? Authorizing Provider  nitrofurantoin, macrocrystal-monohydrate, (MACROBID) 100 MG capsule Take 1 capsule (100 mg total) by mouth 2 (two) times daily. 06/30/19   Darlyne Russian, MD  valACYclovir (VALTREX) 1000 MG tablet Take 1 tablet (1,000 mg total) by mouth 3 (three) times daily. 12/15/19   Noe Gens, PA-C  Zinc Oxide 40 % PSTE Apply 1 application topically as needed (for genital sores). 12/15/19   Noe Gens, PA-C    Family History Family History  Problem Relation Age of Onset  . Cancer Mother   . Hypertension Mother   . Healthy Father     Social History  Social History   Tobacco Use  . Smoking status: Current Every Day Smoker    Packs/day: 0.50    Types: Cigarettes, E-cigarettes  . Smokeless tobacco: Never Used  . Tobacco comment: e-cig  Substance Use Topics  . Alcohol use: Yes    Comment: socially  . Drug use: No     Allergies   Sulfa antibiotics   Review of Systems Review of Systems  Constitutional: Positive for chills and fever.  HENT: Positive for mouth sores. Negative for facial swelling and sore throat.   Gastrointestinal: Negative for abdominal pain, diarrhea, nausea and vomiting.  Genitourinary: Positive for dysuria, genital sores and vaginal discharge. Negative for decreased urine volume, flank pain, frequency, hematuria, menstrual problem, pelvic pain, urgency and vaginal pain.  Musculoskeletal: Negative for back pain and myalgias.     Physical Exam Triage Vital Signs ED Triage Vitals  Enc Vitals Group     BP 12/15/19 0856 114/80     Pulse Rate 12/15/19 0856 89     Resp 12/15/19 0856 18     Temp  12/15/19 0856 99 F (37.2 C)     Temp Source 12/15/19 0856 Oral     SpO2 12/15/19 0856 100 %     Weight --      Height --      Head Circumference --      Peak Flow --      Pain Score 12/15/19 0853 4     Pain Loc --      Pain Edu? --      Excl. in [place]? --    No data found.  Updated Vital Signs BP 114/80 (BP Location: Right Arm)   Pulse 89   Temp 99 F (37.2 C) (Oral)   Resp 18   LMP 12/14/2019   SpO2 100%   Visual Acuity Right Eye Distance:   Left Eye Distance:   Bilateral Distance:    Right Eye Near:   Left Eye Near:    Bilateral Near:     Physical Exam Vitals and nursing note reviewed. Exam conducted with a chaperone present.  Constitutional:      Appearance: Normal appearance. She is well-developed.  HENT:     Head: Normocephalic.     Mouth/Throat:     Lips: Pink. Lesions present.     Mouth: Mucous membranes are moist. Oral lesions present.     Pharynx: Oropharynx is clear. Uvula  midline.     Tonsils: No tonsillar exudate or tonsillar abscesses.   Cardiovascular:     Rate and Rhythm: Normal rate and regular rhythm.  Pulmonary:     Effort: Pulmonary effort is normal. No respiratory distress.     Breath sounds: Normal breath sounds.  Abdominal:     General: There is no distension.     Palpations: Abdomen is soft.     Tenderness: There is no abdominal tenderness. There is no right CVA tenderness or left CVA tenderness.  Genitourinary:    Vagina: Bleeding present. No tenderness.     Cervix: Dilated. Cervical bleeding present.     Uterus: Normal.     Musculoskeletal:        General: Normal range of motion.     Cervical back: Normal range of motion.  Skin:    General: Skin is warm and dry.  Neurological:     Mental Status: She is alert and oriented to person, place, and time.  Psychiatric:        Behavior: Behavior normal.      UC Treatments / Results  Labs (all labs ordered are listed, but only abnormal results are displayed) Labs Reviewed  VIRAL CULTURE VIRC  CERVICOVAGINAL ANCILLARY ONLY    EKG   Radiology No results found.  Procedures Procedures (including critical care time)  Medications Ordered in UC Medications - No data to display  Initial Impression / Assessment and Plan / UC Course  I have reviewed the triage vital signs and the nursing notes.  Pertinent labs & imaging results that were available during my care of the patient were reviewed by me and considered in my medical decision making (see chart for details).    Hx and exam c/w oral and genital herpes. Vaginal swab sent to lab Will start pt on valtrex given pt's first outbreak Encouraged f/u with PCP or OB/GYN AVS provided  Final Clinical Impressions(s) / UC Diagnoses   Final diagnoses:  Herpes simplex vulvovaginitis  Primary herpes simplex infection of lips  Fever and chills  Vaginal sore     Discharge Instructions      Take  the medication as prescribed to  help shorten the duration of your rash.  Based on today's exam, the sore are consistent with herpes virus. This virus can cause sores to keep coming back during times of physical (sick or injured) and emotional stress.   If you develop tingling or itching to your lips or vaginal area in the future, this is likely a sign the rash is about to come up again. It is recommended to start taking the antiviral medication (which will be a different dose for additional outbreaks).    Please follow up with your OB/GYN or primary care provider for ongoing healthcare needs and any recurrent outbreaks.     ED Prescriptions    Medication Sig Dispense Auth. Provider   valACYclovir (VALTREX) 1000 MG tablet Take 1 tablet (1,000 mg total) by mouth 3 (three) times daily. 21 tablet Lurene Shadow, [place]   Zinc Oxide 40 % PSTE Apply 1 application topically as needed (for genital sores). 57 g Lurene Shadow, PA-C     PDMP not reviewed this encounter.   Lurene Shadow, [place] 12/15/19 1048

## 2019-12-15 NOTE — ED Triage Notes (Signed)
Patient presents to Urgent Care with complaints of fever and cold sores on her mouth and genitals since about 8 days ago. Patient reports she does not have any prescription medication for it, states this is the first time this has happened to her.

## 2019-12-20 LAB — CERVICOVAGINAL ANCILLARY ONLY
Bacterial Vaginitis (gardnerella): POSITIVE — AB
Candida Glabrata: NEGATIVE
Candida Vaginitis: POSITIVE — AB
Chlamydia: NEGATIVE
Comment: NEGATIVE
Comment: NEGATIVE
Comment: NEGATIVE
Comment: NEGATIVE
Comment: NEGATIVE
Comment: NORMAL
Neisseria Gonorrhea: NEGATIVE
Trichomonas: NEGATIVE

## 2019-12-20 LAB — HERPES SIMPLEX VIRUS CULTURE W/RFLX TO TYPING
MICRO NUMBER:: 10538114
SPECIMEN QUALITY:: ADEQUATE

## 2019-12-21 ENCOUNTER — Telehealth: Payer: Self-pay

## 2019-12-21 MED ORDER — FLUCONAZOLE 150 MG PO TABS: 150.0000 mg | ORAL_TABLET | ORAL | 0 refills | Status: AC

## 2019-12-21 MED ORDER — METRONIDAZOLE 500 MG PO TABS: 500.0000 mg | ORAL_TABLET | Freq: Two times a day (BID) | ORAL | 0 refills | Status: AC

## 2019-12-21 NOTE — Telephone Encounter (Signed)
Patient called asking about her STI test results from last week. Results given, pt was positive for HSV type 1, BV, and yeast. Diflucan and metronidazole called into the patient's pharmacy of choice per treatment guidelines. Antiviral prescribed during the patient's visit last week.

## 2020-04-15 ENCOUNTER — Emergency Department
Admission: EM | Admit: 2020-04-15 | Discharge: 2020-04-15 | Disposition: A | Discharge status: Home / Self Care | Payer: Medicaid Other | Source: Home / Self Care

## 2020-04-15 ENCOUNTER — Other Ambulatory Visit: Payer: Self-pay

## 2020-04-15 NOTE — ED Notes (Signed)
Patient LWBS due to lack of insurance coverage.
